# Patient Record
Sex: Male | Born: 1995 | Hispanic: No | Marital: Single | Smoking: Never smoker
Health system: Southern US, Community
[De-identification: ages and names within clinical notes are randomized; demographics above are authoritative.]

---
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
## 2016-06-19 ENCOUNTER — Encounter (HOSPITAL_BASED_OUTPATIENT_CLINIC_OR_DEPARTMENT_OTHER): Payer: Self-pay

## 2016-06-19 ENCOUNTER — Emergency Department (HOSPITAL_BASED_OUTPATIENT_CLINIC_OR_DEPARTMENT_OTHER): Payer: BLUE CROSS/BLUE SHIELD

## 2016-06-19 ENCOUNTER — Emergency Department (HOSPITAL_BASED_OUTPATIENT_CLINIC_OR_DEPARTMENT_OTHER)
Admission: EM | Admit: 2016-06-19 | Discharge: 2016-06-19 | Disposition: A | Discharge status: Home / Self Care | Payer: BLUE CROSS/BLUE SHIELD | Attending: Emergency Medicine | Admitting: Emergency Medicine

## 2016-06-19 DIAGNOSIS — F12929 Cannabis use, unspecified with intoxication, unspecified: Secondary | ICD-10-CM | POA: Diagnosis not present

## 2016-06-19 DIAGNOSIS — R0789 Other chest pain: Secondary | ICD-10-CM | POA: Diagnosis present

## 2016-06-19 DIAGNOSIS — F419 Anxiety disorder, unspecified: Secondary | ICD-10-CM | POA: Insufficient documentation

## 2016-06-19 LAB — RAPID URINE DRUG SCREEN, HOSP PERFORMED
Amphetamines: NOT DETECTED
Barbiturates: NOT DETECTED
Benzodiazepines: NOT DETECTED
Cocaine: NOT DETECTED
OPIATES: NOT DETECTED
Tetrahydrocannabinol: POSITIVE — AB

## 2016-06-19 MED ORDER — GI COCKTAIL ~~LOC~~
30.0000 mL | Freq: Once | ORAL | Status: AC
  Administered 2016-06-19: 30 mL via ORAL
  Filled 2016-06-19: qty 30

## 2016-06-19 MED ORDER — FAMOTIDINE 20 MG PO TABS: 20.0000 mg | ORAL_TABLET | Freq: Two times a day (BID) | ORAL | Status: AC

## 2016-06-19 NOTE — ED Notes (Signed)
MD at bedside. 

## 2016-06-19 NOTE — ED Notes (Signed)
Pt states over the last week he has had 3 episodes of CP, numbness to extremities-denies recent life change to evoke anxiety-pt appears anxious-mother with pt

## 2016-06-19 NOTE — ED Provider Notes (Signed)
CSN: [id number]     Arrival date & time 06/19/16  0146 History   First MD Initiated Contact with Patient 06/19/16 0206     Chief Complaint  Patient presents with  . Chest Pain     (Consider location/radiation/quality/duration/timing/severity/associated sxs/prior Treatment) Patient is a 20 y.o. male presenting with chest pain. The history is provided by the patient.  Chest Pain Pain location:  Substernal area Pain quality: not tearing   Pain radiates to:  Does not radiate Pain radiates to the back: no   Pain severity:  Moderate Onset quality:  Gradual Progression:  Waxing and waning Chronicity:  New Context: stress   Context: not breathing and no trauma   Relieved by:  Nothing Worsened by:  Nothing tried Ineffective treatments:  None tried Associated symptoms: no abdominal pain, no fever, no palpitations and not vomiting   Risk factors: male sex   Risk factors: no aortic disease   Risk factors comment:  Marijuana use Mom states he is very tremulous during the episodes  History reviewed. No pertinent past medical history. History reviewed. No pertinent past surgical history. History reviewed. No pertinent family history. Social History  Substance Use Topics  . Smoking status: Never Smoker   . Smokeless tobacco: None  . Alcohol Use: Yes     Comment: occ    Review of Systems  Constitutional: Negative for fever.  Cardiovascular: Positive for chest pain. Negative for palpitations and leg swelling.  Gastrointestinal: Negative for vomiting and abdominal pain.  Psychiatric/Behavioral: The patient is nervous/anxious.   All other systems reviewed and are negative.     Allergies  Review of patient's allergies indicates no known allergies.  Home Medications   Prior to Admission medications   Not on File   BP 143/81 mmHg  Pulse 69  Temp(Src) 98.1 F (36.7 C) (Oral)  Resp 20  Ht 5\' 10"  (1.778 m)  Wt 150 lb (68.04 kg)  BMI 21.52 kg/m2  SpO2 100% Physical Exam   Constitutional: He is oriented to person, place, and time. He appears well-developed and well-nourished. No distress.  HENT:  Head: Normocephalic and atraumatic.  Mouth/Throat: Oropharynx is clear and moist.  Eyes: Conjunctivae are normal. Pupils are equal, round, and reactive to light.  Neck: Neck supple.  Cardiovascular: Normal rate, regular rhythm and intact distal pulses.   Pulmonary/Chest: Effort normal and breath sounds normal. He has no wheezes. He has no rales.  Abdominal: Soft. Bowel sounds are normal. There is no tenderness. There is no rebound and no guarding.  Musculoskeletal: Normal range of motion. He exhibits no tenderness.  Neurological: He is alert and oriented to person, place, and time. He has normal reflexes.  Skin: Skin is warm and dry.  Psychiatric: His mood appears anxious.    ED Course  Procedures (including critical care time) Labs Review Labs Reviewed  URINE RAPID DRUG SCREEN, HOSP PERFORMED - Abnormal; Notable for the following:    Tetrahydrocannabinol POSITIVE (*)    All other components within normal limits    Imaging Review Dg Chest 2 View  06/19/2016  CLINICAL DATA:  Acute onset of generalized chest pain and extremity numbness. Initial encounter. EXAM: CHEST  2 VIEW COMPARISON:  None. FINDINGS: The lungs are well-aerated and clear. There is no evidence of focal opacification, pleural effusion or pneumothorax. The heart is normal in size; the mediastinal contour is within normal limits. No acute osseous abnormalities are seen. IMPRESSION: No acute cardiopulmonary process seen. Electronically Signed   By: Roanna RaiderJeffery  Chang  M.D.   On: 06/19/2016 02:38   I have personally reviewed and evaluated these images and lab results as part of my medical decision-making.   EKG Interpretation   Date/Time:  Monday June 19 2016 02:03:29 EDT Ventricular Rate:  57 PR Interval:  154 QRS Duration: 98 QT Interval:  412 QTC Calculation: 401 R Axis:   95 Text  Interpretation:  Sinus bradycardia Confirmed by [facility]  MD,  Aanyah Loa ([id number]) on 06/19/2016 2:07:43 AM      MDM   Final diagnoses:  None    Filed Vitals:   06/19/16 0155  BP: 143/81  Pulse: 69  Temp: 98.1 F (36.7 C)  Resp: 20   Results for orders placed or performed during the hospital encounter of 06/19/16  Rapid urine drug screen (hospital performed)  Result Value Ref Range   Opiates NONE DETECTED NONE DETECTED   Cocaine NONE DETECTED NONE DETECTED   Benzodiazepines NONE DETECTED NONE DETECTED   Amphetamines NONE DETECTED NONE DETECTED   Tetrahydrocannabinol POSITIVE (A) NONE DETECTED   Barbiturates NONE DETECTED NONE DETECTED   Dg Chest 2 View  06/19/2016  CLINICAL DATA:  Acute onset of generalized chest pain and extremity numbness. Initial encounter. EXAM: CHEST  2 VIEW COMPARISON:  None. FINDINGS: The lungs are well-aerated and clear. There is no evidence of focal opacification, pleural effusion or pneumothorax. The heart is normal in size; the mediastinal contour is within normal limits. No acute osseous abnormalities are seen. IMPRESSION: No acute cardiopulmonary process seen. Electronically Signed   By: Roanna RaiderJeffery  Chang M.D.   On: 06/19/2016 02:38    Medications  gi cocktail (Maalox,Lidocaine,Donnatal) (not administered)   PERC negative wells 0 highly doubt PE, also doubt ACS in this young healthy patient I suspect this is anxiety exacerbated by his marijuana use.  Today it started after smoking pot.  I have counseled him on marijuana cessation and close follow up with his PMD to discuss his anxiety.  Patient and mom verbalize understanding and agree to follow up   Elandra Powell, MD 06/19/16 [phone]

## 2016-06-19 NOTE — Discharge Instructions (Signed)

## 2016-11-15 IMAGING — CR DG CHEST 2V
2 series · 2 of 2 positions shown · non-contrast
Comparison: None.

CLINICAL DATA: Acute onset of generalized chest pain and extremity
numbness. Initial encounter.

EXAM:
CHEST  2 VIEW

[w chest pa]
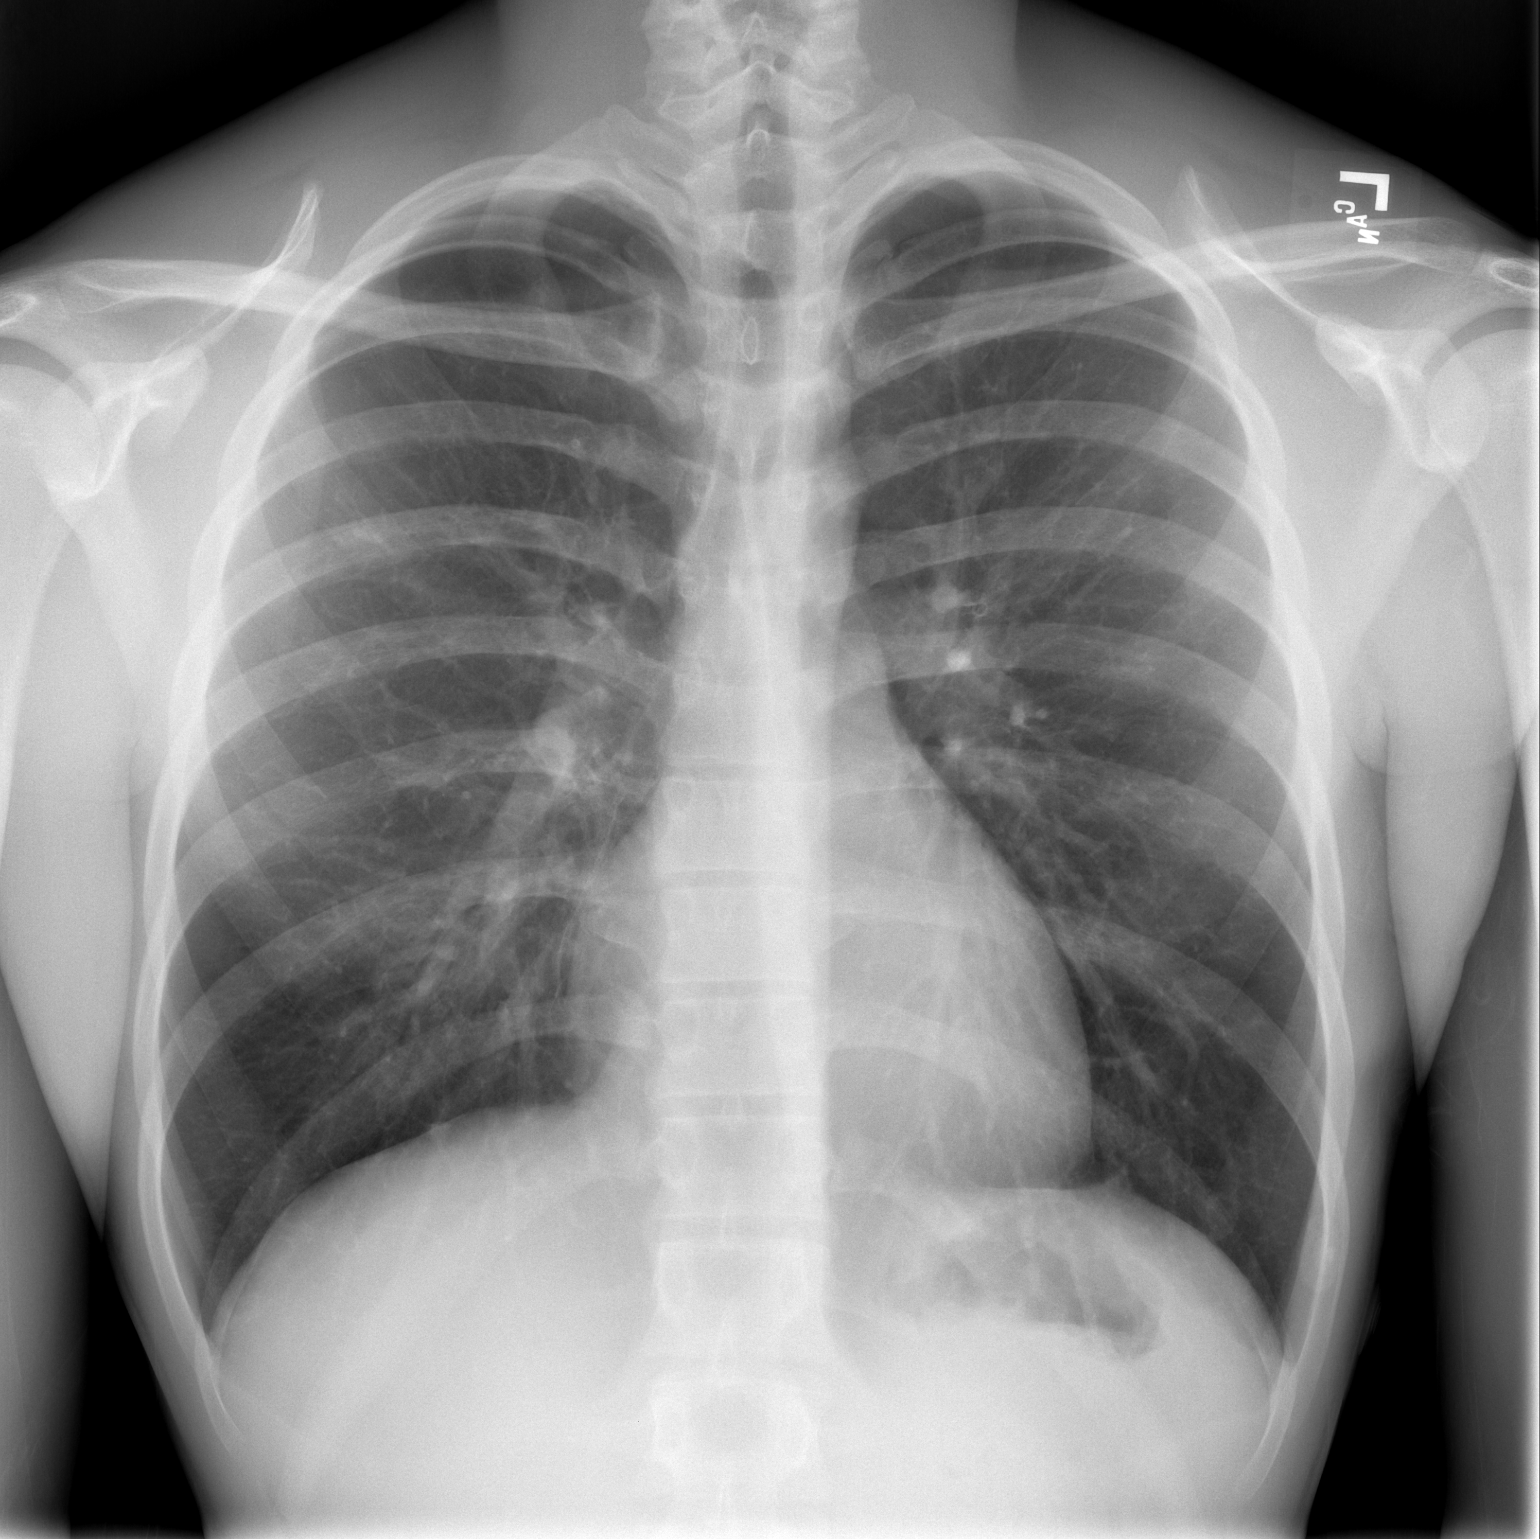

[w chest lat]
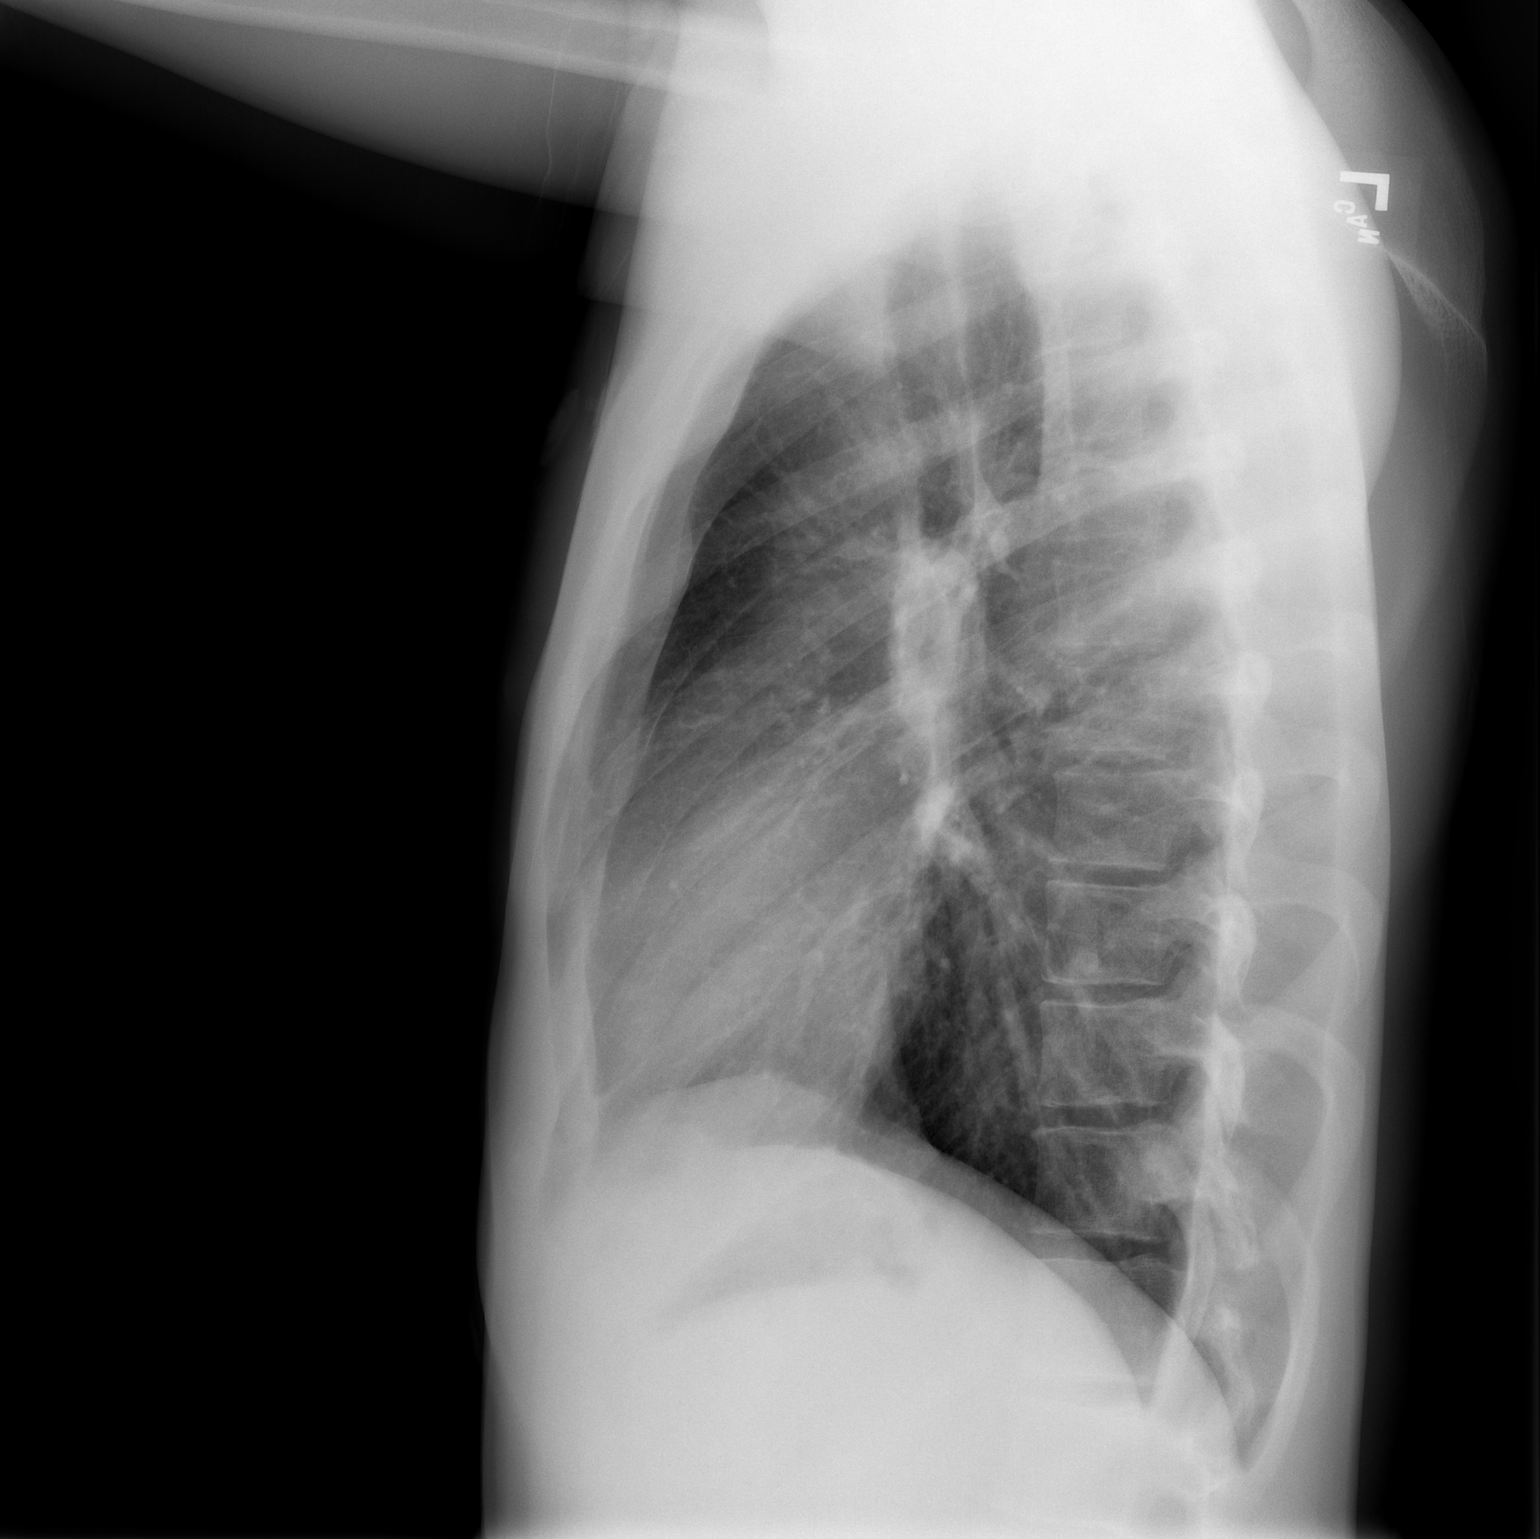

[2 of 2 positions shown; findings below may reference images not displayed]

FINDINGS: The lungs are well-aerated and clear. There is no evidence of focal
opacification, pleural effusion or pneumothorax.

The heart is normal in size; the mediastinal contour is within
normal limits. No acute osseous abnormalities are seen.
IMPRESSION: No acute cardiopulmonary process seen.
